# Patient Record
Sex: Female | Born: 1935 | Race: White | Hispanic: No
Health system: Southern US, Community
[De-identification: ages and names within clinical notes are randomized; demographics above are authoritative.]

---

## 2004-10-01 ENCOUNTER — Ambulatory Visit: Payer: Self-pay | Admitting: Unknown Physician Specialty

## 2005-01-04 ENCOUNTER — Ambulatory Visit: Payer: Self-pay | Admitting: Orthopedic Surgery

## 2005-01-23 ENCOUNTER — Ambulatory Visit: Payer: Self-pay | Admitting: Orthopedic Surgery

## 2005-05-09 ENCOUNTER — Ambulatory Visit: Payer: Self-pay | Admitting: Orthopedic Surgery

## 2005-08-08 ENCOUNTER — Ambulatory Visit: Payer: Self-pay | Admitting: Unknown Physician Specialty

## 2005-08-08 ENCOUNTER — Encounter: Payer: Self-pay | Admitting: Unknown Physician Specialty

## 2005-08-14 ENCOUNTER — Encounter: Payer: Self-pay | Admitting: Unknown Physician Specialty

## 2005-09-13 ENCOUNTER — Encounter: Payer: Self-pay | Admitting: Unknown Physician Specialty

## 2005-11-19 ENCOUNTER — Ambulatory Visit: Payer: Self-pay | Admitting: Unknown Physician Specialty

## 2005-12-10 ENCOUNTER — Ambulatory Visit: Payer: Self-pay | Admitting: Unknown Physician Specialty

## 2006-01-14 ENCOUNTER — Other Ambulatory Visit: Payer: Self-pay

## 2006-01-14 ENCOUNTER — Emergency Department: Payer: Self-pay | Admitting: Emergency Medicine

## 2006-01-14 ENCOUNTER — Ambulatory Visit: Payer: Self-pay | Admitting: Orthopedic Surgery

## 2006-12-11 ENCOUNTER — Other Ambulatory Visit: Payer: Self-pay

## 2006-12-11 ENCOUNTER — Ambulatory Visit: Payer: Self-pay | Admitting: Orthopedic Surgery

## 2006-12-18 ENCOUNTER — Ambulatory Visit: Payer: Self-pay | Admitting: Orthopedic Surgery

## 2007-02-19 ENCOUNTER — Ambulatory Visit: Payer: Self-pay | Admitting: Unknown Physician Specialty

## 2007-03-30 ENCOUNTER — Emergency Department: Payer: Self-pay | Admitting: Unknown Physician Specialty

## 2007-03-30 ENCOUNTER — Other Ambulatory Visit: Payer: Self-pay

## 2007-04-01 ENCOUNTER — Ambulatory Visit: Payer: Self-pay | Admitting: Unknown Physician Specialty

## 2007-05-18 ENCOUNTER — Emergency Department: Payer: Self-pay

## 2008-06-14 ENCOUNTER — Ambulatory Visit: Payer: Self-pay | Admitting: Unknown Physician Specialty

## 2008-09-21 ENCOUNTER — Emergency Department: Payer: Self-pay | Admitting: Emergency Medicine

## 2009-01-22 ENCOUNTER — Emergency Department: Payer: Self-pay | Admitting: Unknown Physician Specialty

## 2009-01-28 ENCOUNTER — Emergency Department: Payer: Self-pay | Admitting: Emergency Medicine

## 2009-02-03 ENCOUNTER — Ambulatory Visit: Payer: Self-pay | Admitting: Unknown Physician Specialty

## 2009-02-15 ENCOUNTER — Ambulatory Visit: Payer: Self-pay | Admitting: Ophthalmology

## 2009-02-28 ENCOUNTER — Ambulatory Visit: Payer: Self-pay | Admitting: Ophthalmology

## 2009-06-16 ENCOUNTER — Ambulatory Visit: Payer: Self-pay | Admitting: Unknown Physician Specialty

## 2010-04-14 ENCOUNTER — Emergency Department: Payer: Self-pay | Admitting: Emergency Medicine

## 2010-07-16 ENCOUNTER — Emergency Department: Payer: Self-pay | Admitting: Emergency Medicine

## 2010-09-01 ENCOUNTER — Inpatient Hospital Stay: Payer: Self-pay | Admitting: Internal Medicine

## 2010-09-10 ENCOUNTER — Ambulatory Visit: Payer: Self-pay | Admitting: Unknown Physician Specialty

## 2011-02-21 ENCOUNTER — Ambulatory Visit: Payer: Self-pay | Admitting: Unknown Physician Specialty

## 2011-09-12 ENCOUNTER — Ambulatory Visit: Payer: Self-pay | Admitting: Unknown Physician Specialty

## 2012-03-31 ENCOUNTER — Ambulatory Visit: Payer: Self-pay | Admitting: Unknown Physician Specialty

## 2012-04-10 ENCOUNTER — Encounter: Payer: Self-pay | Admitting: Cardiothoracic Surgery

## 2012-04-13 ENCOUNTER — Encounter: Payer: Self-pay | Admitting: Cardiothoracic Surgery

## 2012-08-18 ENCOUNTER — Inpatient Hospital Stay: Payer: Self-pay | Admitting: Internal Medicine

## 2012-08-18 LAB — CBC WITH DIFFERENTIAL/PLATELET
Basophil #: 0 10*3/uL (ref 0.0–0.1)
Basophil %: 0.1 %
Eosinophil %: 0.1 %
HCT: 34.8 % — ABNORMAL LOW (ref 35.0–47.0)
MCH: 26.9 pg (ref 26.0–34.0)
MCV: 82 fL (ref 80–100)
Monocyte #: 0.7 x10 3/mm (ref 0.2–0.9)
Monocyte %: 6.3 %
Platelet: 404 10*3/uL (ref 150–440)
RDW: 17.1 % — ABNORMAL HIGH (ref 11.5–14.5)

## 2012-08-18 LAB — COMPREHENSIVE METABOLIC PANEL
Alkaline Phosphatase: 109 U/L (ref 50–136)
Anion Gap: 9 (ref 7–16)
BUN: 14 mg/dL (ref 7–18)
Calcium, Total: 9 mg/dL (ref 8.5–10.1)
Chloride: 106 mmol/L (ref 98–107)
Creatinine: 0.57 mg/dL — ABNORMAL LOW (ref 0.60–1.30)
EGFR (African American): >60
Osmolality: 284 (ref 275–301)
SGOT(AST): 23 U/L (ref 15–37)
Total Protein: 7.6 g/dL (ref 6.4–8.2)

## 2012-08-18 LAB — URINALYSIS, COMPLETE
Leukocyte Esterase: NEGATIVE
Ph: 7 (ref 4.5–8.0)
Protein: NEGATIVE
RBC,UR: <1 /HPF (ref 0–5)
Specific Gravity: 1.023 (ref 1.003–1.030)
Squamous Epithelial: <1

## 2012-09-16 ENCOUNTER — Ambulatory Visit: Payer: Self-pay | Admitting: Unknown Physician Specialty

## 2012-11-28 ENCOUNTER — Inpatient Hospital Stay: Payer: Self-pay | Admitting: Internal Medicine

## 2012-11-28 LAB — CBC WITH DIFFERENTIAL/PLATELET
Basophil #: 0 10*3/uL (ref 0.0–0.1)
Basophil %: 0.4 %
Eosinophil #: 0.1 10*3/uL (ref 0.0–0.7)
Eosinophil %: 0.5 %
HCT: 43.5 % (ref 35.0–47.0)
HGB: 13.7 g/dL (ref 12.0–16.0)
MCH: 26.2 pg (ref 26.0–34.0)
MCHC: 31.6 g/dL — ABNORMAL LOW (ref 32.0–36.0)
Monocyte #: 0.7 x10 3/mm (ref 0.2–0.9)
Neutrophil #: 11.2 10*3/uL — ABNORMAL HIGH (ref 1.4–6.5)
WBC: 13 10*3/uL — ABNORMAL HIGH (ref 3.6–11.0)

## 2012-11-28 LAB — COMPREHENSIVE METABOLIC PANEL
Anion Gap: 9 (ref 7–16)
BUN: 41 mg/dL — ABNORMAL HIGH (ref 7–18)
Bilirubin,Total: 0.9 mg/dL (ref 0.2–1.0)
EGFR (African American): >60
EGFR (Non-African Amer.): >60
Glucose: 135 mg/dL — ABNORMAL HIGH (ref 65–99)
SGOT(AST): 36 U/L (ref 15–37)
SGPT (ALT): 26 U/L (ref 12–78)

## 2012-11-28 LAB — URINALYSIS, COMPLETE
Bilirubin,UR: NEGATIVE
Glucose,UR: NEGATIVE mg/dL (ref 0–75)
Nitrite: POSITIVE
RBC,UR: 5 /HPF (ref 0–5)
Squamous Epithelial: NONE SEEN

## 2012-11-28 LAB — TROPONIN I
Troponin-I: 0.11 ng/mL — ABNORMAL HIGH
Troponin-I: 0.03 ng/mL

## 2012-11-28 LAB — CK TOTAL AND CKMB (NOT AT ARMC): CK-MB: 1.9 ng/mL (ref 0.5–3.6)

## 2012-11-28 LAB — RAPID INFLUENZA A&B ANTIGENS

## 2012-11-29 LAB — CBC WITH DIFFERENTIAL/PLATELET
HCT: 39 % (ref 35.0–47.0)
HGB: 12 g/dL (ref 12.0–16.0)
Lymphocyte #: 1.6 10*3/uL (ref 1.0–3.6)
MCH: 25.7 pg — ABNORMAL LOW (ref 26.0–34.0)
MCV: 83 fL (ref 80–100)
Monocyte #: 1 x10 3/mm — ABNORMAL HIGH (ref 0.2–0.9)
Neutrophil #: 9.3 10*3/uL — ABNORMAL HIGH (ref 1.4–6.5)
Neutrophil %: 78.2 %
Platelet: 368 10*3/uL (ref 150–440)
RBC: 4.69 10*6/uL (ref 3.80–5.20)
WBC: 11.9 10*3/uL — ABNORMAL HIGH (ref 3.6–11.0)

## 2012-11-29 LAB — BASIC METABOLIC PANEL
Anion Gap: 7 (ref 7–16)
BUN: 39 mg/dL — ABNORMAL HIGH (ref 7–18)
Calcium, Total: 8.8 mg/dL (ref 8.5–10.1)
Calcium, Total: 8.7 mg/dL (ref 8.5–10.1)
Chloride: 125 mmol/L — ABNORMAL HIGH (ref 98–107)
Creatinine: 0.51 mg/dL — ABNORMAL LOW (ref 0.60–1.30)
EGFR (African American): >60
EGFR (African American): >60
EGFR (Non-African Amer.): >60
EGFR (Non-African Amer.): >60
Glucose: 135 mg/dL — ABNORMAL HIGH (ref 65–99)
Glucose: 144 mg/dL — ABNORMAL HIGH (ref 65–99)
Osmolality: 324 (ref 275–301)
Osmolality: 323 (ref 275–301)
Potassium: 3.8 mmol/L (ref 3.5–5.1)
Sodium: 158 mmol/L — ABNORMAL HIGH (ref 136–145)

## 2012-11-29 LAB — MAGNESIUM: Magnesium: 2.3 mg/dL

## 2012-11-29 LAB — LIPID PANEL
HDL Cholesterol: 21 mg/dL — ABNORMAL LOW (ref 40–60)
Ldl Cholesterol, Calc: 84 mg/dL (ref 0–100)

## 2012-11-29 LAB — CK TOTAL AND CKMB (NOT AT ARMC): CK, Total: 237 U/L — ABNORMAL HIGH (ref 21–215)

## 2012-11-29 LAB — TROPONIN I: Troponin-I: 0.13 ng/mL — ABNORMAL HIGH

## 2012-11-30 LAB — BASIC METABOLIC PANEL
Anion Gap: 9 (ref 7–16)
BUN: 23 mg/dL — ABNORMAL HIGH (ref 7–18)
Calcium, Total: 8.3 mg/dL — ABNORMAL LOW (ref 8.5–10.1)
Chloride: 124 mmol/L — ABNORMAL HIGH (ref 98–107)
Creatinine: 0.48 mg/dL — ABNORMAL LOW (ref 0.60–1.30)
EGFR (African American): >60
Glucose: 113 mg/dL — ABNORMAL HIGH (ref 65–99)
Osmolality: 310 (ref 275–301)
Potassium: 3.7 mmol/L (ref 3.5–5.1)
Sodium: 154 mmol/L — ABNORMAL HIGH (ref 136–145)

## 2012-11-30 LAB — TROPONIN I: Troponin-I: 0.07 ng/mL — ABNORMAL HIGH

## 2012-11-30 LAB — URINE CULTURE

## 2012-12-01 LAB — BASIC METABOLIC PANEL
Anion Gap: 8 (ref 7–16)
BUN: 11 mg/dL (ref 7–18)
Calcium, Total: 8.1 mg/dL — ABNORMAL LOW (ref 8.5–10.1)
Chloride: 115 mmol/L — ABNORMAL HIGH (ref 98–107)
Creatinine: 0.47 mg/dL — ABNORMAL LOW (ref 0.60–1.30)
EGFR (African American): >60
EGFR (Non-African Amer.): >60
Potassium: 3.1 mmol/L — ABNORMAL LOW (ref 3.5–5.1)

## 2012-12-04 LAB — CULTURE, BLOOD (SINGLE)

## 2012-12-12 DEATH — deceased

## 2012-12-15 LAB — CULTURE, BLOOD (SINGLE)

## 2013-11-11 IMAGING — CR DG FEMUR 2V*R*
1 series · 4 of 4 positions shown · non-contrast
Comparison: none

REASON FOR EXAM: pain - right upper leg
COMMENTS:

PROCEDURE:     DXR - DXR FEMUR RIGHT  - August 18, 2012  [DATE]
RESULT:     Right femur images demonstrate degenerative change of the right
knee without definite fracture, dislocation or radiopaque foreign body.

[Series 1: ap · 0.17mm/px · 4 of 4 slices shown]
[im 1/4]
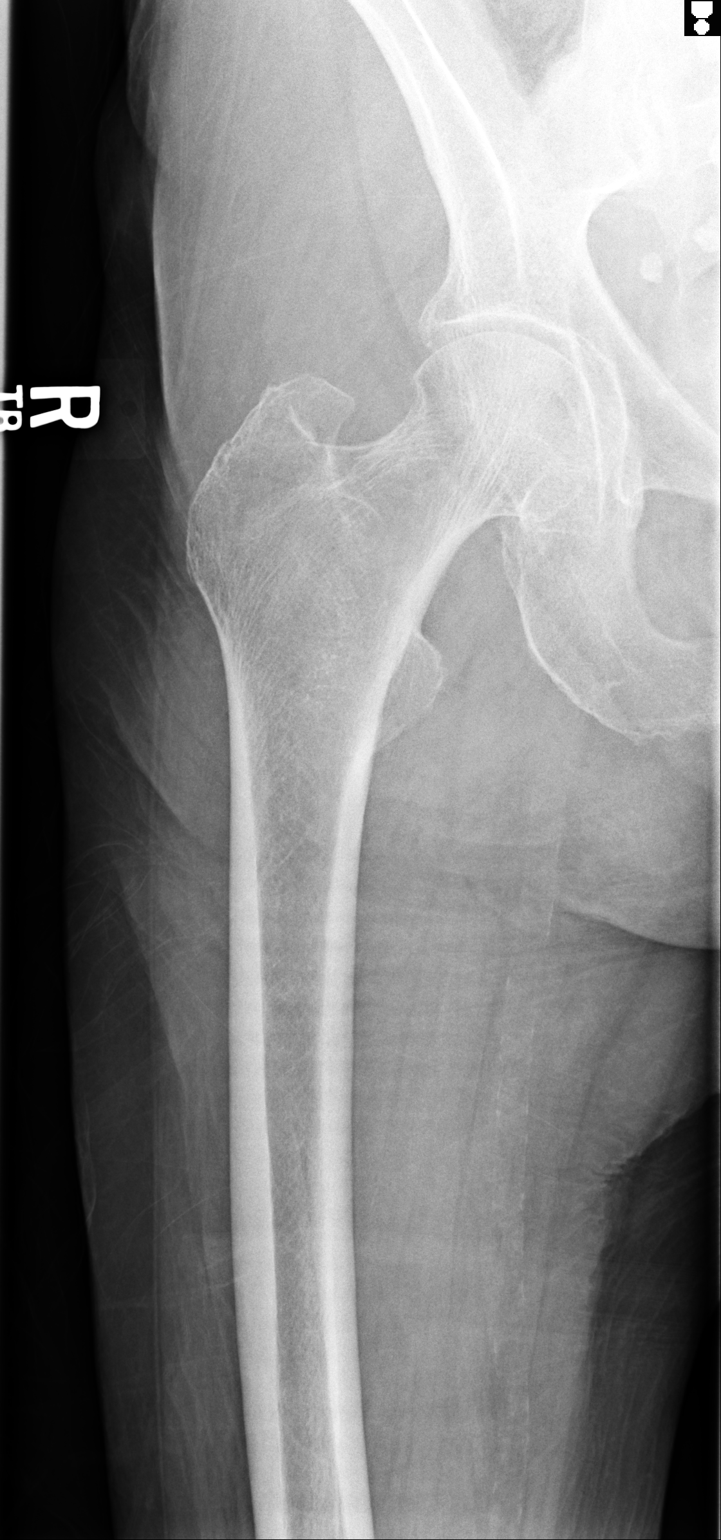
[im 2/4]
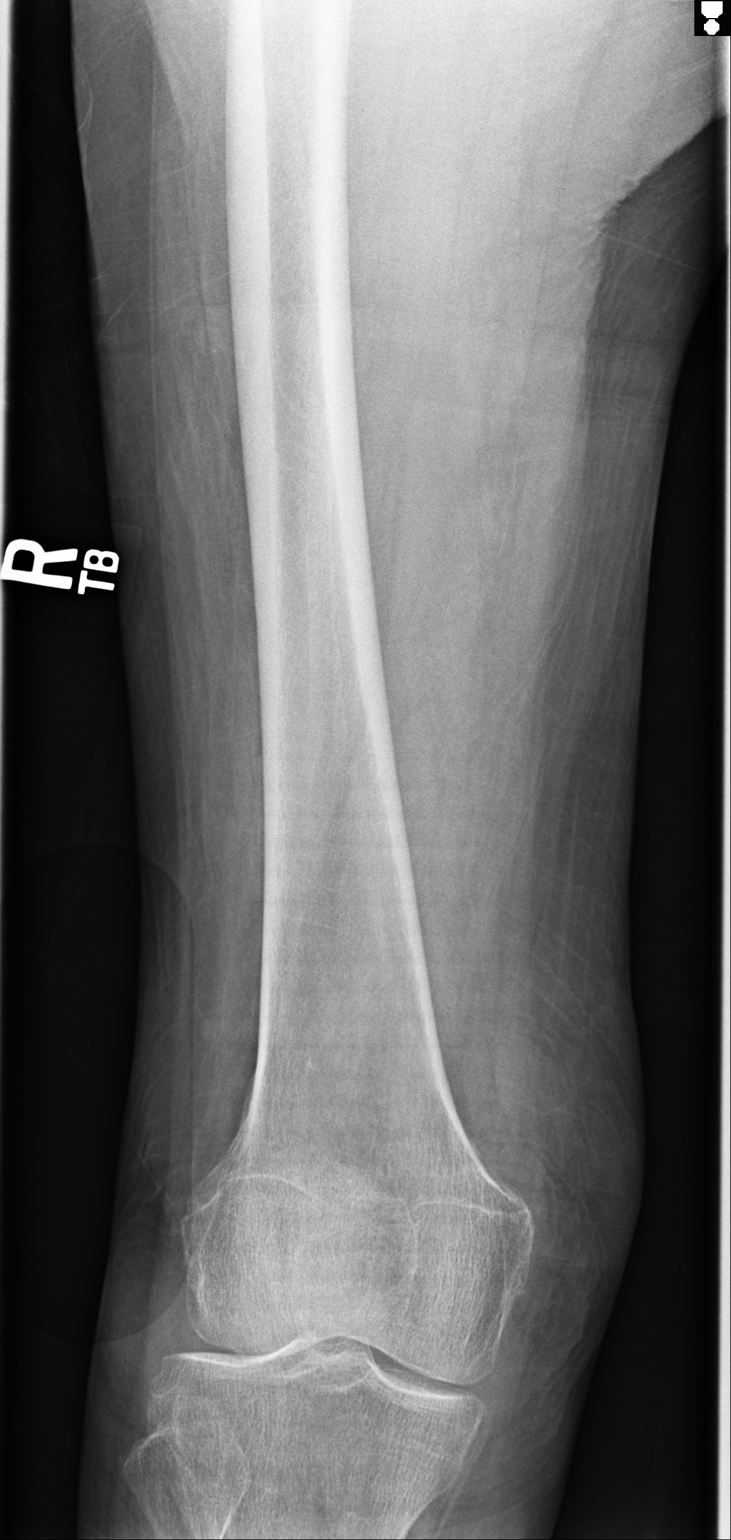
[im 3/4]
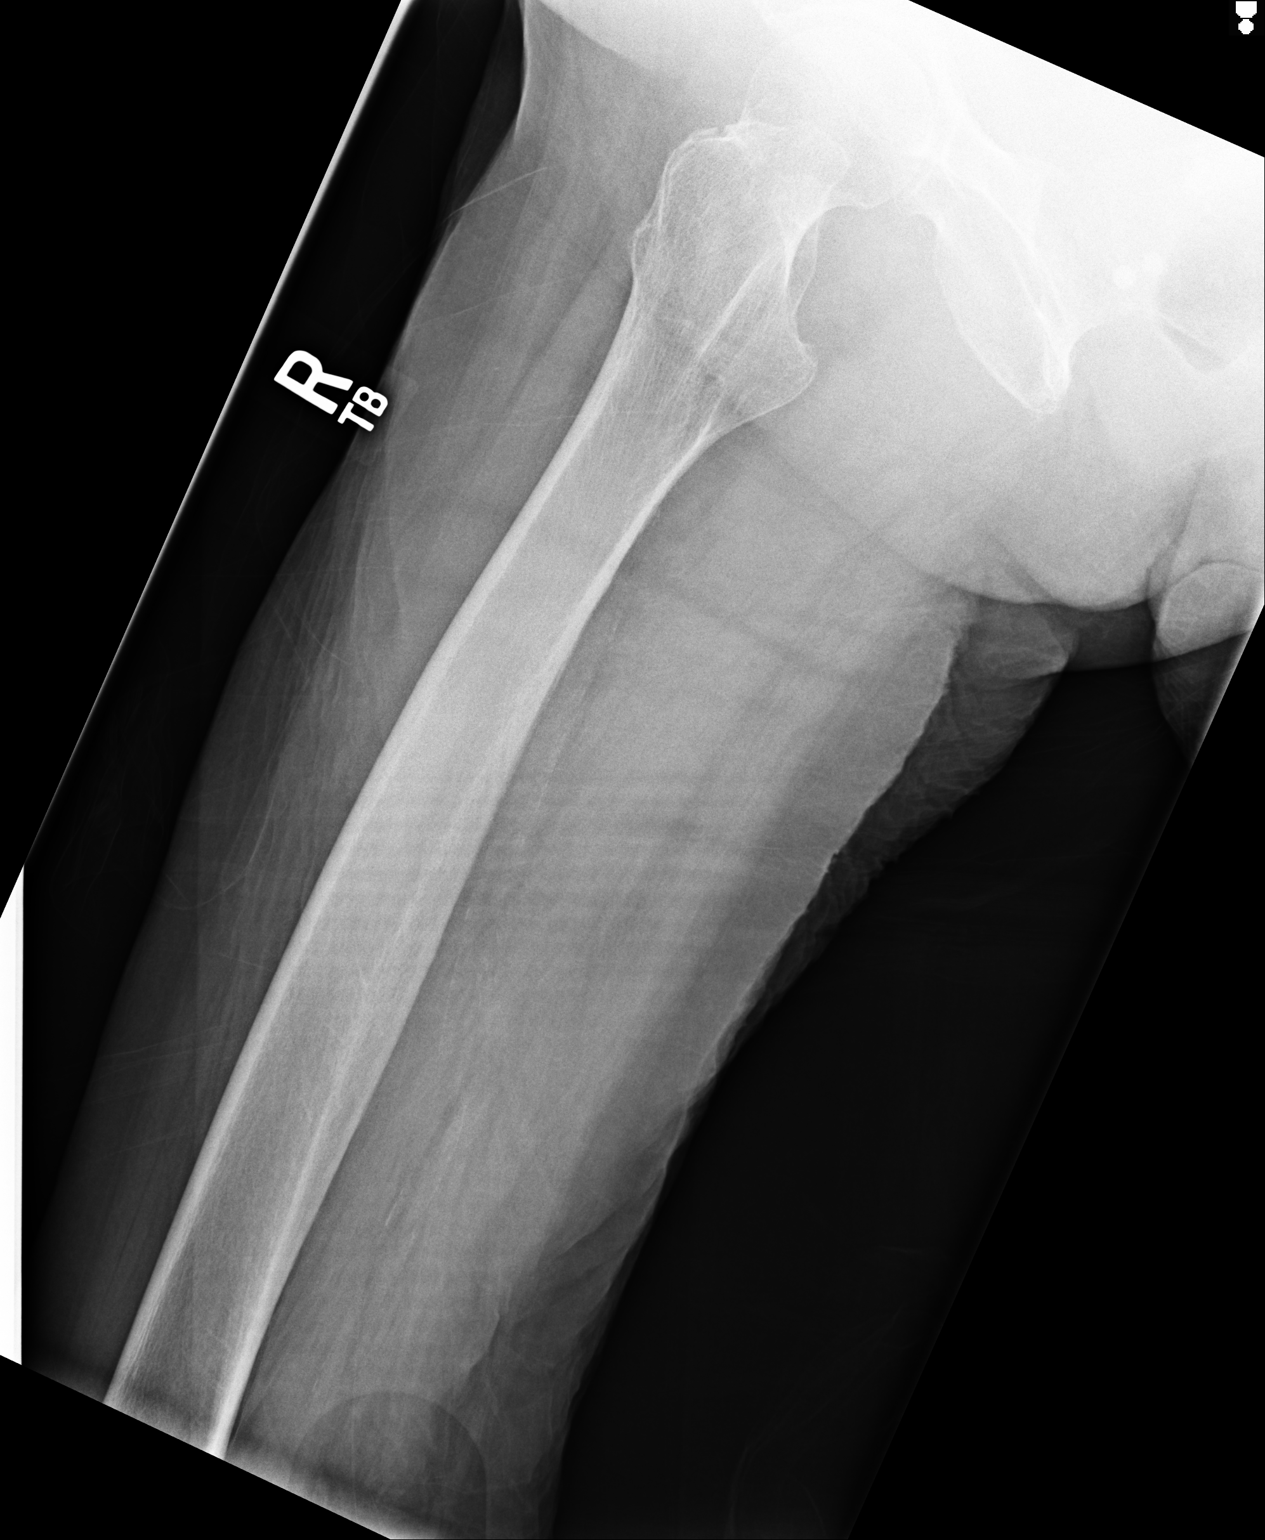
[im 4/4]
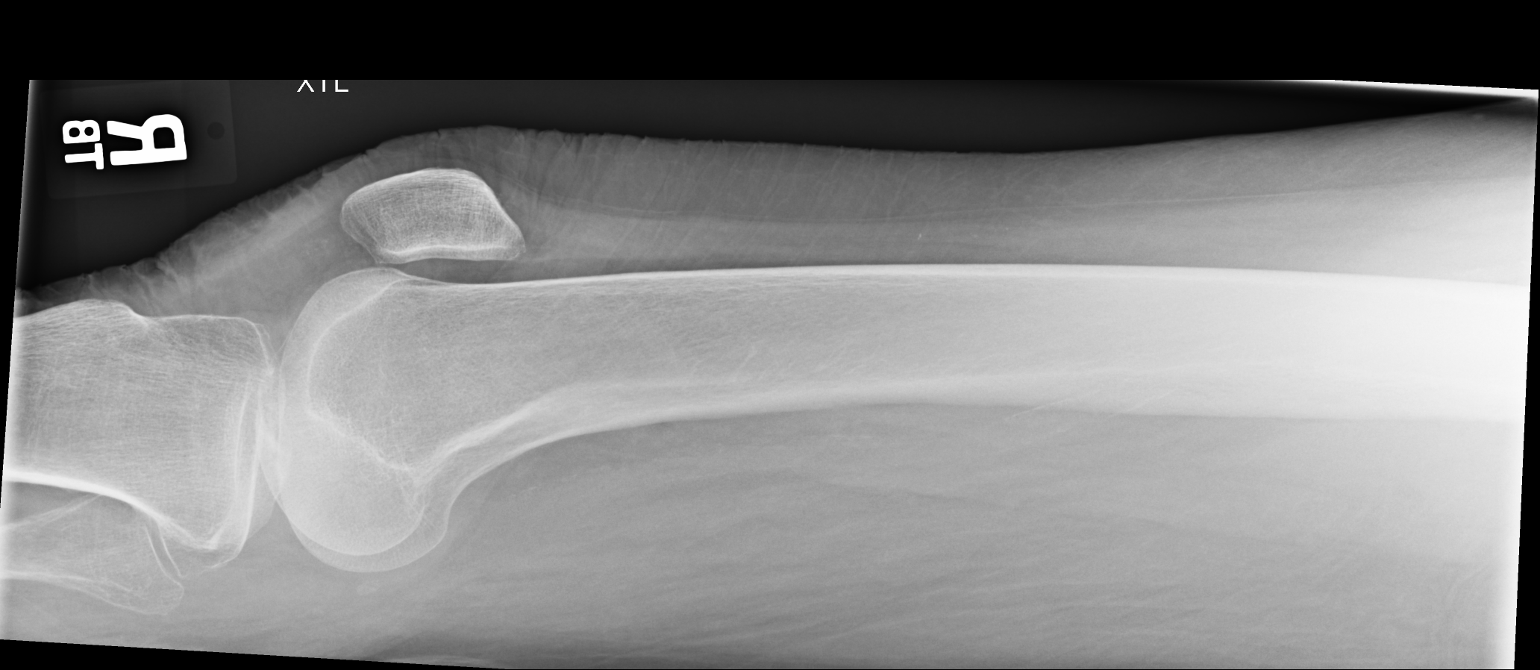

[4 of 4 positions shown; findings below may reference images not displayed]

IMPRESSION: Please see above.

[REDACTED]

## 2013-11-11 IMAGING — CT CT OF THE RIGHT HIP WITHOUT CONTRAST
1 series · 15 of 32 positions shown, 19 images · non-contrast
Comparison: none

REASON FOR EXAM: ongoing pain - Right hip - unable to ambulate
COMMENTS:

[Series 2: hip 3.0 b70s · axial · 0.39mm/px · z∈[-958,-775]mm · 15 of 69 slices shown, 19 images]
[im 5/69  soft-tissue]
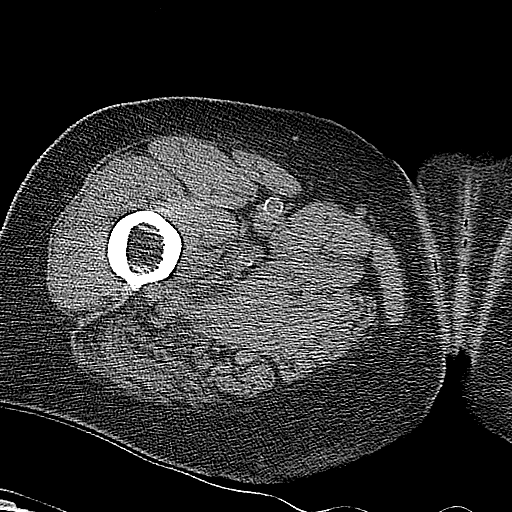
[im 5/69  bone]
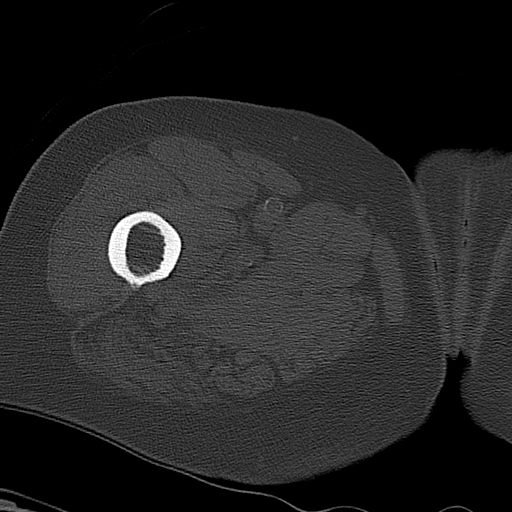
[im 9/69  soft-tissue]
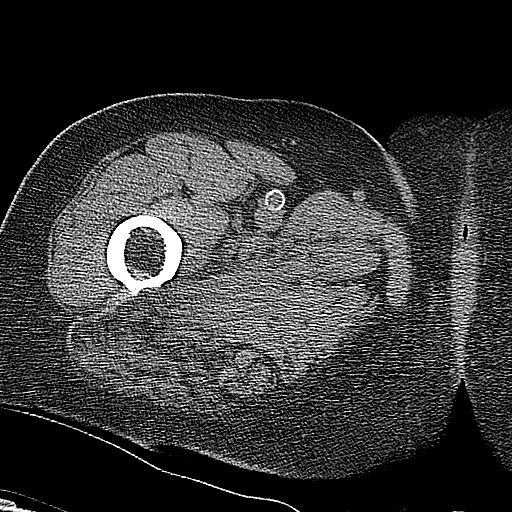
[im 14/69  soft-tissue]
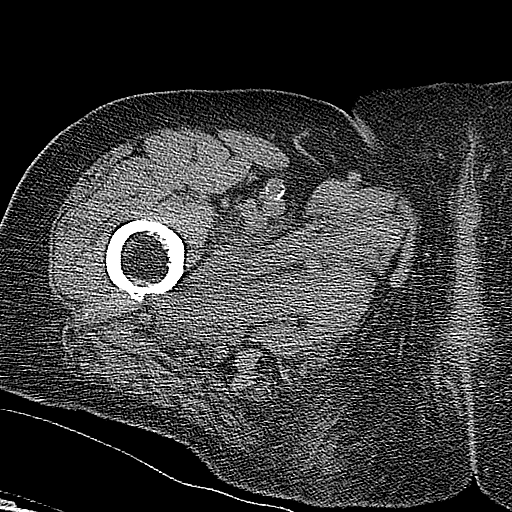
[im 20/69  soft-tissue]
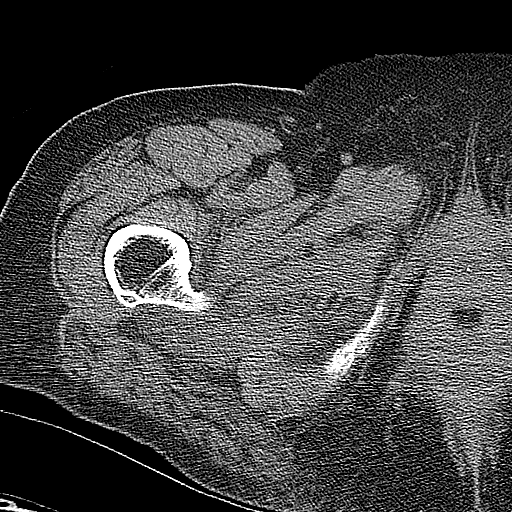
[im 25/69  soft-tissue]
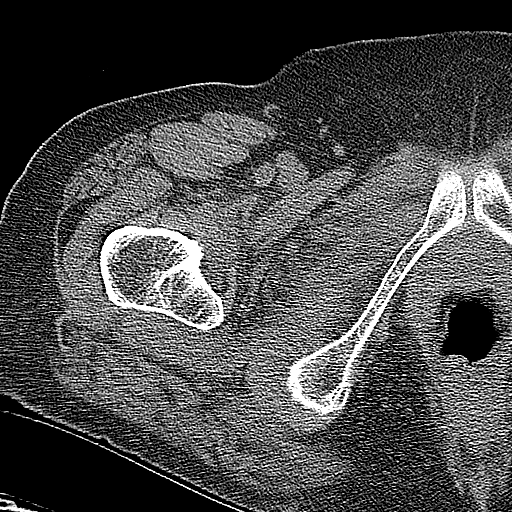
[im 29/69  soft-tissue]
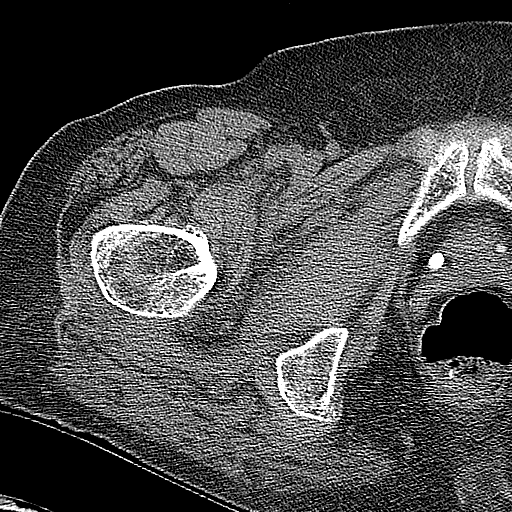
[im 36/69  soft-tissue]
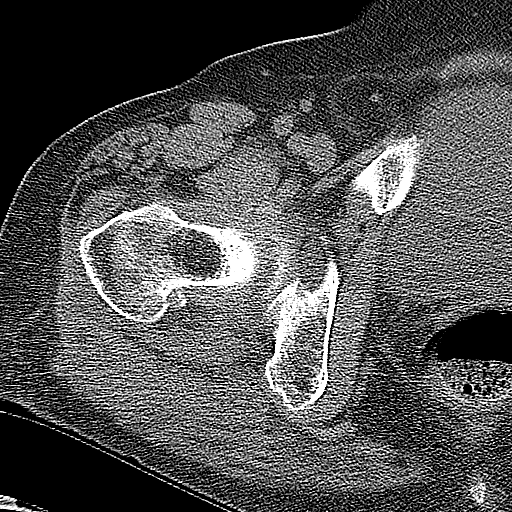
[im 40/69  soft-tissue]
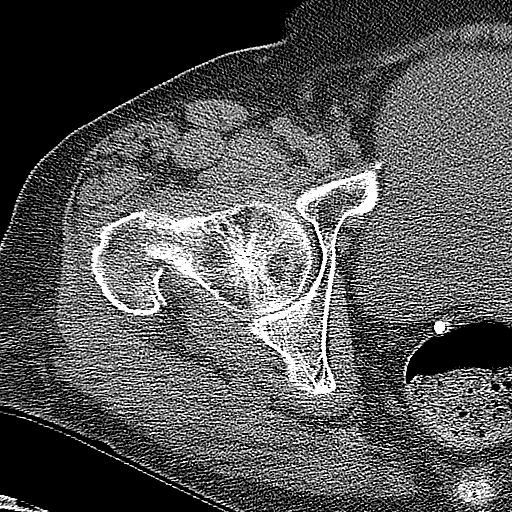
[im 44/69  soft-tissue]
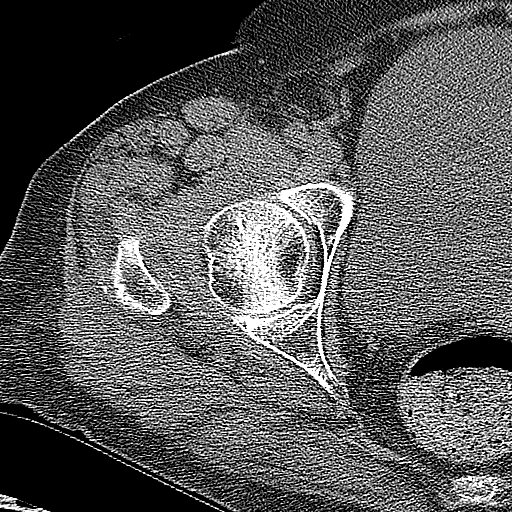
[im 44/69  bone]
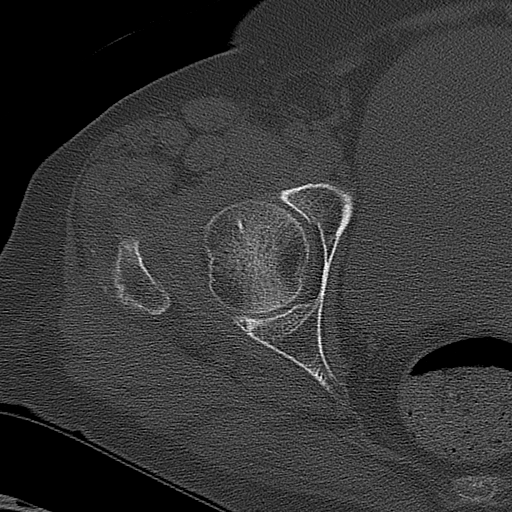
[im 49/69  soft-tissue]
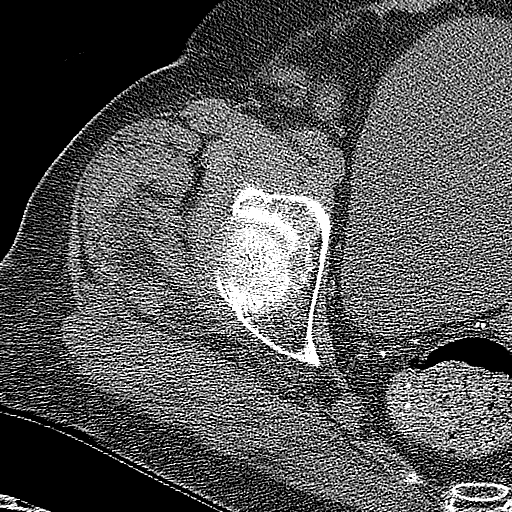
[im 55/69  soft-tissue]
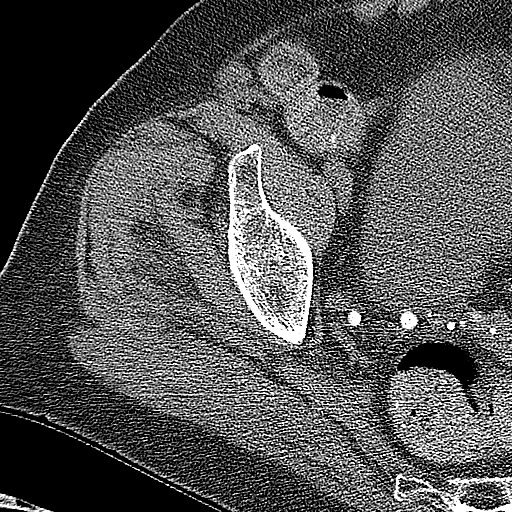
[im 60/69  soft-tissue]
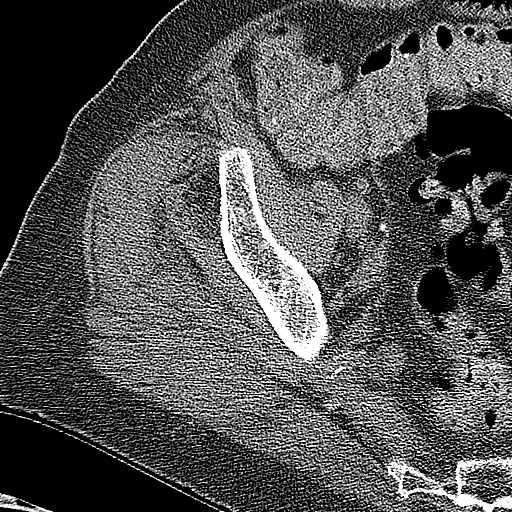
[im 60/69  lung]
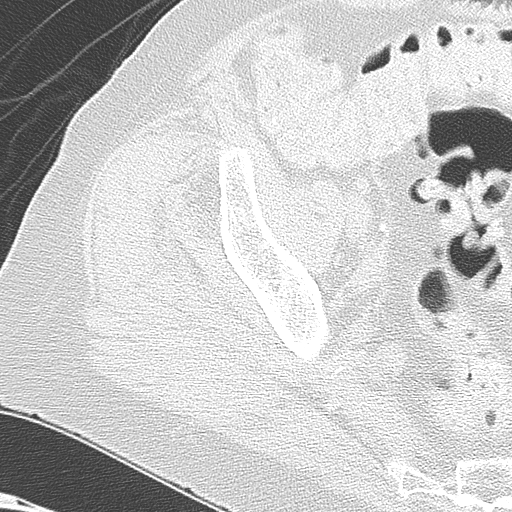
[im 62/69  lung]
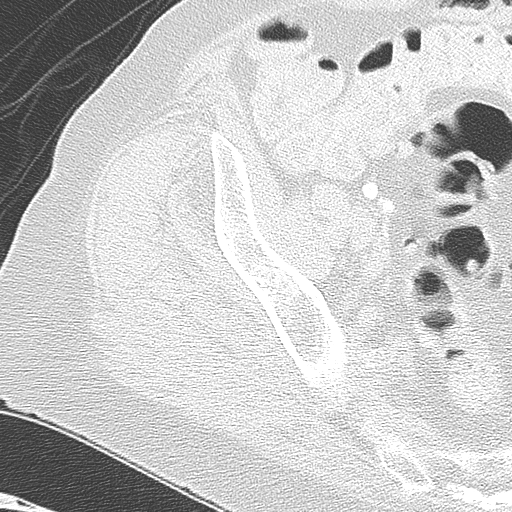
[im 64/69  soft-tissue]
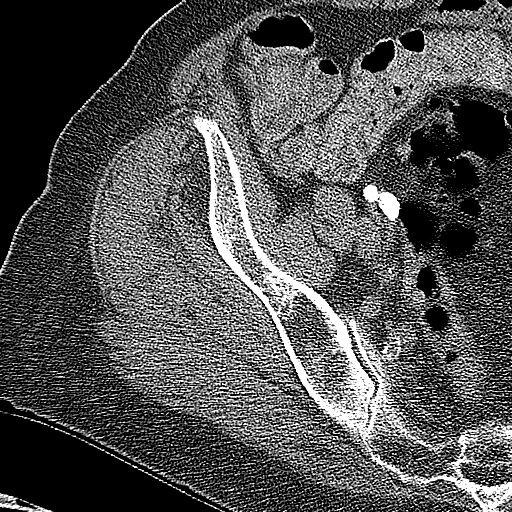
[im 64/69  lung]
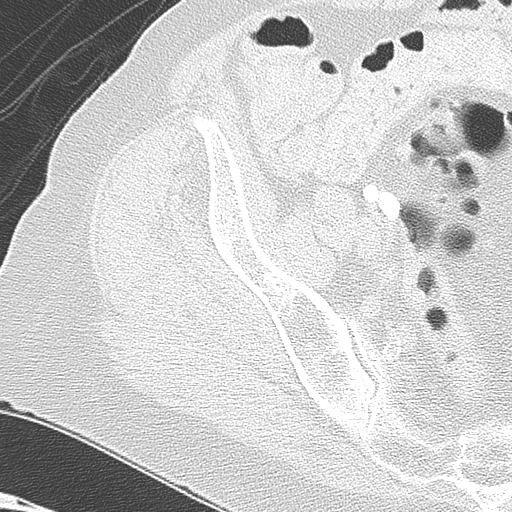
[im 66/69  lung]
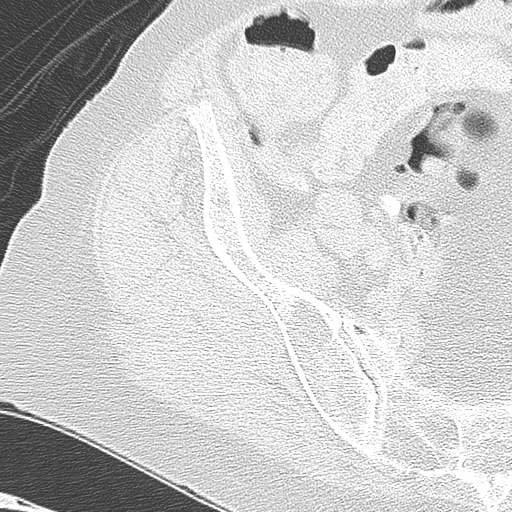

[15 of 32 positions shown; findings below may reference images not displayed]

PROCEDURE:     CT  - CT HIP RIGHT WITHOUT CONTRAST  - August 18, 2012  [DATE]

RESULT:     Sagittal, axial, and coronal images through the right hip are
reviewed. A bone algorithm was employed.

There is cortical disruption along the medial aspect of the greater
trochanter which extends posteriorly consistent with a fracture. There may
be minimal involvement of the superior aspect of the base of the femoral
neck. This is demonstrated best on images 30 through approximately 32 on the
axial images. There is no evidence of cortical disruption along the inferior
aspect of the femoral neck and so the fracture may be confined to the
greater trochanter and a small portion of the superior aspect of the base of
the femoral neck.

The acetabulum appears intact. The right superior and inferior pubic rami
exhibit no acute abnormalities.
IMPRESSION: 1. There is a definite minimally displaced fracture involving the greater
trochanter.
2. I cannot exclude minimal involvement of the superior aspect of the base
of the femoral neck. There is no definite through and through fracture of
the femoral neck base. It may be useful to consider the patient for MRI in
an effort to detect bone marrow edema within the femoral neck if she can
tolerate the procedure.

[REDACTED]

## 2014-02-21 IMAGING — CR DG CHEST 1V PORT
1 series · 1 of 1 positions shown · non-contrast
Comparison: none

REASON FOR EXAM: fever
COMMENTS:

PROCEDURE:     DXR - DXR PORTABLE CHEST SINGLE VIEW  - November 28, 2012 [DATE]
RESULT:     The lungs are clear. The cardiac silhouette and visualized bony
skeleton are unremarkable.

[ap]
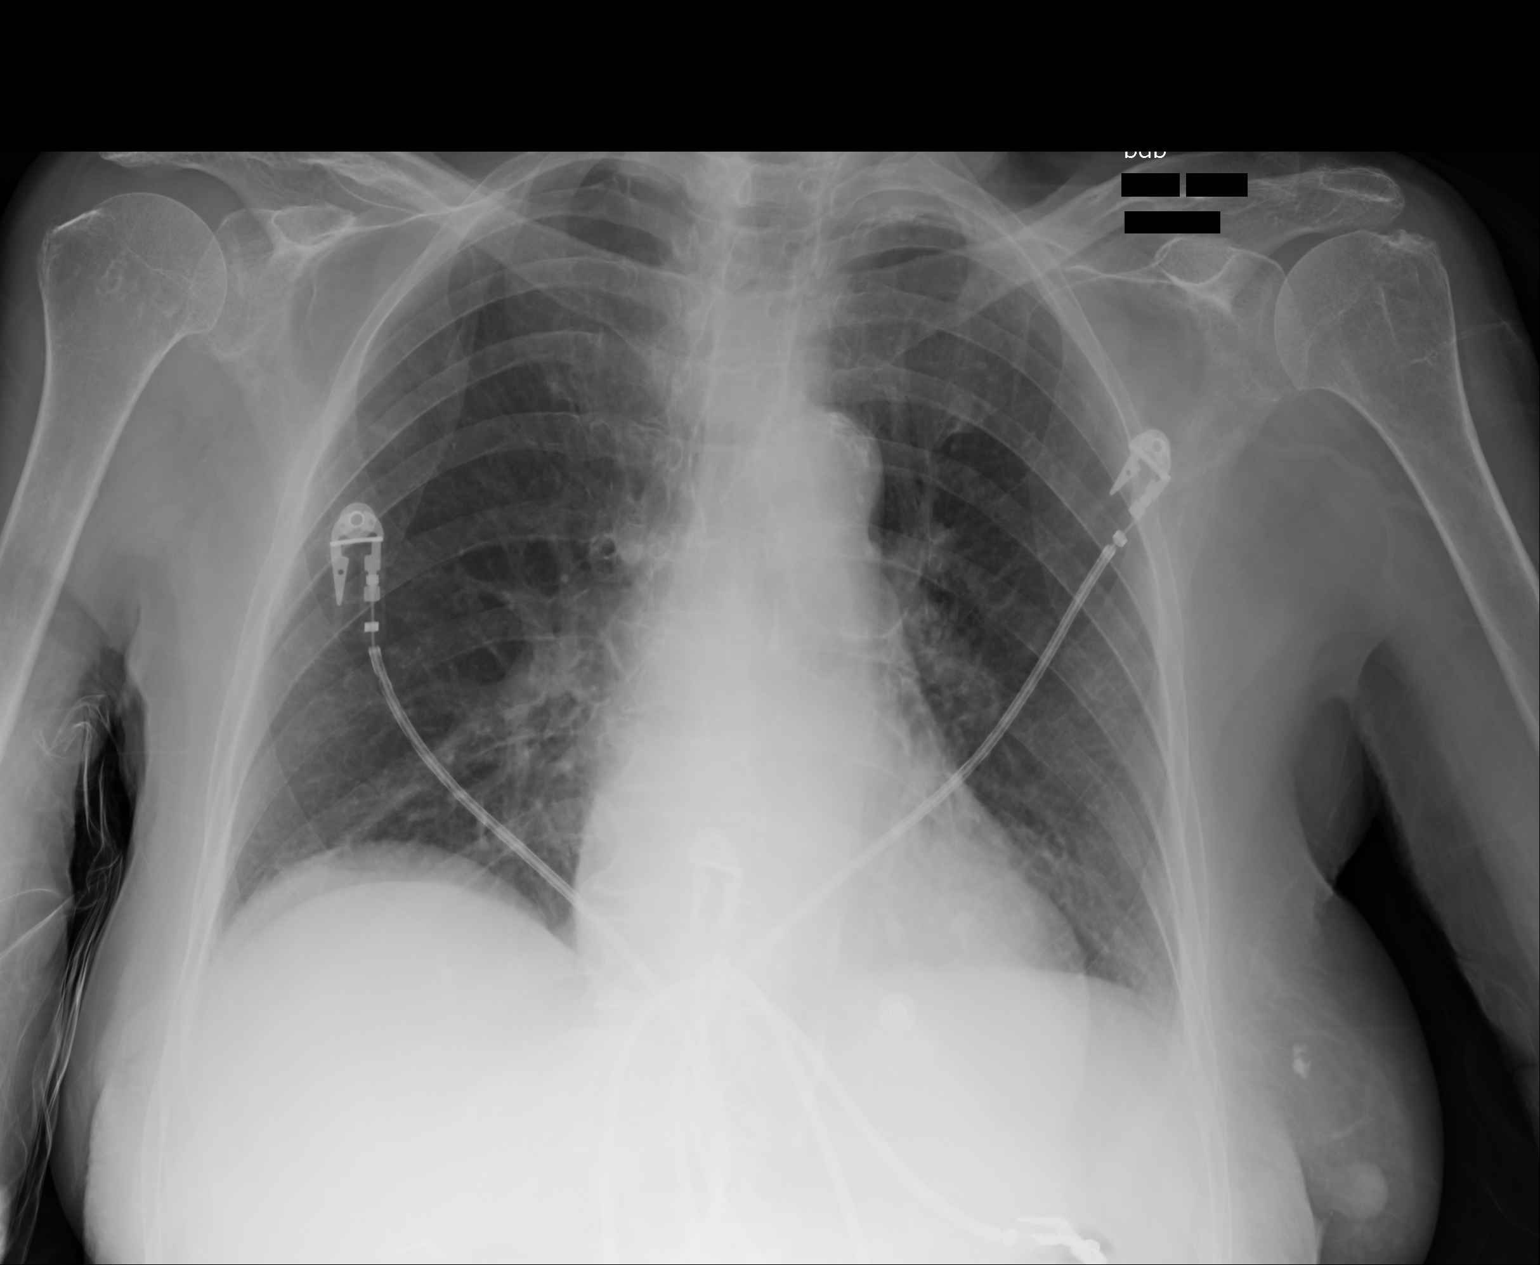

[1 of 1 positions shown; findings below may reference images not displayed]

IMPRESSION: 1. Chest radiograph without evidence of acute cardiopulmonary disease.

## 2015-01-31 NOTE — Discharge Summary (Signed)
PATIENT NAME:  Cassie DolinWHITLOW, Rylea O MR#:  [id number] DATE OF BIRTH:  1920-05-19  DATE OF ADMISSION:  08/18/2012 DATE OF DISCHARGE:  08/21/2012  DISPOSITION: To [facility].   DISCHARGE DIAGNOSES:  1. Right hip fracture.  2. History of advanced dementia.  3. Hypertension.  4. Depression.  5. Anxiety.  6. Hyperlipidemia.  7. Gastroesophageal reflux disease.   DISCHARGE MEDICATIONS:  1. Namenda 10 mg daily. 2. Zoloft 25 mg daily. 3. Prilosec 20 mg p.o. daily.  4. Aspirin 81 mg daily.   5. Acetaminophen/oxycodone 5/325 mg every 6 hours as needed for pain.  6. Amlodipine 10 mg daily.  DIET: Low sodium diet.   FOLLOW UP: The patient has follow up with Dr. Ernest PineHooten on 11/12.    CONSULTATIONS: Physical Therapy consult.   LABORATORY, DIAGNOSTIC AND RADIOLOGICAL DATA: CT of the right hip showed a minimally displaced fracture of the greater trochanter and probably cannot exclude minimal involvement of superior aspect of the base of the femoral neck. WBC 10.9, hemoglobin 11.4, hematocrit 34.8, platelets 404. Electrolytes: Sodium is 142, potassium 3.9, chloride 106, bicarbonate 27, BUN 14, creatinine 0.57. Urine is amber-colored urine. Knee x-ray shows joint space narrowing without fracture. Pelvic x-ray shows no acute bony abnormality.   HOSPITAL COURSE: The patient is a 81 year old female with severe advanced dementia, admitted to the Medical Service because the patient had a fall with right hip fracture.  Look at the History and Physical for full details. Dr. Rosita KeaMenz was been contacted in the ER and  recommended a nonsurgical approach. The patient was admitted to Medicine and seen by Physical Therapy for full weightbearing. Because of severe advanced dementia and her episodes of agitation, she required continuous supervision in the hospital. The patient was living with her husband, and the daughter and the son were taking care off them on a frequent basis; but because of the fracture, the patient  needing a lot of assistance, Physical Therapy also recommended rehab. The patient has limited ambulation with a rolling walker due to confusion and decreased endurance. The patient is [facility] rehab and then probably change to a skilled nursing facility. The patient is going for physical therapy and needs a front-wheeled walker.  Hypertension: She is not on blood pressure medication at home, but here blood pressure is running high this morning, 182/83, and heart rate is around 88. She was started on Norvasc 10 mg daily. The patient can continue other medications.   I discussed the plan with the patient's daughter. She can continue pain medicine if she needs to.   TIME SPENT ON DISCHARGE PREPARATION: More than 30 minutes   ____________________________ Katha HammingSnehalatha Alaura Schippers, MD sk:cbb D: 08/21/2012 14:06:29 ET T: 08/22/2012 15:16:25 ET JOB#: [id number]  cc: Katha HammingSnehalatha Climmie Cronce, MD, <Dictator> Katha HammingSNEHALATHA Yuvan Medinger MD ELECTRONICALLY SIGNED 09/08/2012 11:46

## 2015-01-31 NOTE — H&P (Signed)
PATIENT NAME:  Cassie [name], Cassie [name] MR#:  [id number] DATE OF BIRTH:  20-Dec-1919  DATE OF ADMISSION:  08/18/2012  PRIMARY CARE PHYSICIAN: Silver Huguenin, MD   CHIEF COMPLAINT: Right hip pain after fall.   HISTORY OF PRESENT ILLNESS: Cassie [name] is a 81 year old Caucasian female who has a past medical history of depression, anxiety, dementia, and hyperlipidemia who comes to the Emergency Room accompanied by her son after she had a fall yesterday and the day before at home and started having significant pain in her right hip. Her initial pelvis and right hip x-rays were negative. In the Emergency Room, the patient was unable to bear any weight on the right hip and was in significant pain. CT of the right hip was ordered which showed minimally displaced right greater trochanter hip fracture. ER physician discussed with Dr. Rosita Kea who recommends nonsurgical approach and, hence, the patient is being admitted on the Medicine service. She currently is denying any pain. She has some back pain.   PAST MEDICAL HISTORY:  1. History of Alzheimer's dementia.  2. Depression/anxiety.  3. Arthritis with chronic back pain.  4. Hyperlipidemia.  5. Gastroesophageal reflux disease.   PAST SURGICAL HISTORY:  1. Index finger joint replacement.  2. Carpal tunnel release.  3. Appendectomy.   ALLERGIES: Codeine and sulfa.   MEDICATIONS:  1. Zoloft 25 mg p.o. daily.  2. Prilosec 20 mg p.o. daily.  3. Namenda 10 mg p.o. daily.  4. Aspirin 81 mg p.o. daily.   SOCIAL HISTORY: Lives at home with her husband who is 55 years old. Nonsmoker. The patient does have some home health which is arranged by the patient's daughter who stays with them as well.   FAMILY HISTORY: Positive for hypertension.   REVIEW OF SYSTEMS: CONSTITUTIONAL: No fever. Positive fatigue, weakness, and right hip pain. EYES: No blurred or double vision. No glaucoma. ENT: No tinnitus, ear pain, hearing loss. RESPIRATORY: No cough, wheeze, hemoptysis.  CARDIOVASCULAR: No chest pain or edema. GI: No nausea, vomiting, diarrhea, or abdominal pain. GU: No dysuria or hematuria. ENDOCRINE: No polyuria, nocturia, or thyroid problems. HEMATOLOGY: No anemia or easy bruising. SKIN: No acne or rash. MUSCULOSKELETAL: Positive for arthritis and pain in the right hip. NEUROLOGICAL: No CVA or TIA. Positive for dementia. PSYCH: No anxiety. No symptoms suggestive of anxiety or depression. All other systems reviewed and negative.   PHYSICAL EXAMINATION:   GENERAL: The patient is awake, alert, oriented x3. She is hard of hearing.   VITAL SIGNS: Afebrile, pulse 82, blood pressure 165/80, sats are 96% on room air.   HEENT: Atraumatic, normocephalic. Pupils equal, round, and reactive to light and accommodation. Extraocular movements intact. Oral mucosa is dry.   NECK: Supple. No JVD. No carotid bruit.   RESPIRATORY: Clear to auscultation bilaterally. No rales, rhonchi, respiratory distress, or labored breathing.   CARDIOVASCULAR: Both the heart sounds are normal. Rate, rhythm regular. PMI not lateralized. Chest nontender.   ABDOMEN: Soft, benign, nontender. No organomegaly.   EXTREMITIES: Good pedal pulses. Good femoral pulses. The patient has trace lower extremity edema.   SKIN: Has several skin tears with scabs on the tibial shins bilaterally and the left knee. The patient has a very "papery" skin and very dry skin.   NEUROLOGIC: Grossly intact cranial nerves II through XII. No focal deficits noted.   LABORATORY, DIAGNOSTIC, AND RADIOLOGICAL DATA: EKG shows normal sinus rhythm, possible left atrial enlargement.   CT of the right hip showed definite minimal displaced fracture involving the greater  trochanter. I cannot exclude minimal involvement of the superior aspect of the base of the femoral neck. There is no definite through and through fracture of the femoral neck base.   Hemoglobin and hematocrit 11.4 and 34.8, platelet count 404. Comprehensive  metabolic panel within normal limits. Urinalysis negative for urinary tract infection.   Right knee suggestive of degenerative joint disease.   AP pelvis no acute fracture noted.   ASSESSMENT: 81 year old Cassie [name] with history of dementia, anxiety/depression who has had several falls at home comes in with:  1. Right hip pain after fall with CT evidence of minimal displaced right greater trochanter hip fracture status post fall at home. The patient has had several falls at home in the past few days. She forgets to use her walker which has been prescribed to her. Dr. Rosita KeaMenz spoke with the ER MD and suggests nonoperative management. Will await Dr. Neomia GlassMenz's recommendations regarding starting physical therapy. P.r.n. IV pain meds. Will consider rehab once seen by Dr. Rosita KeaMenz.  2. Frequent falls at home. The patient will need rehabilitation.  3. Dementia. Continue Namenda.  4. Depression/anxiety. Continue Zoloft.  5. DVT prophylaxis with heparin sub-Q b.i.d.  6. Further work-up according to the patient's clinical course.   Hospital admission plan was discussed with patient, the patient's son, Cassie [name], who was present in the Emergency Room.      TIME SPENT: 55 minutes.   ____________________________ Wylie HailSona A. Allena KatzPatel, MD sap:drc D: 08/18/2012 16:25:17 ET T: 08/18/2012 17:01:25 ET JOB#: [id number]  cc: Ailey Wessling A. Allena KatzPatel, MD, <Dictator> Yetta FlockAileen H. Miller, MD Willow OraSONA A Bonnee Zertuche MD ELECTRONICALLY SIGNED 08/18/2012 17:40

## 2015-02-03 NOTE — H&P (Signed)
PATIENT NAME:  Cassie, [name] MR#:  [id number] DATE OF BIRTH:  01-Jul-1920  DATE OF ADMISSION:  11/28/2012  PRIMARY CARE PHYSICIAN: Cassie Mosses T. Garey Ham, MD  History obtained in part from the patient's daughter -Cassie [name], phone number [phone], and from old records available in our system.   CHIEF COMPLAINT: Fever, altered mental status.   HISTORY OF PRESENT ILLNESS: A 81 year old female, who is a nursing home resident for the last few weeks. In the past, she was staying at home with family. She had her hip fractured and admitted in November 2013 to our hospital and sent to rehab. Almost after a month at rehab, she recovered enough that she was able to walk with support, and family took her home, but then she was too much dependent in day-to-day activity and demented that they could not take care anymore, and they sent her to [facility]. Today, she is sent over here because of fever and altered mental status. The patient is not able to give any history. Records and results checked, and she was found having urinary tract infection as UA is positive, so she is being admitted for that.   REVIEW OF SYSTEMS: Unable to get, as the patient has altered mental status.   PAST MEDICAL HISTORY: Alzheimer's dementia, depression, arthritis, hyperlipidemia, gastroesophageal reflux disease.   PAST SURGICAL HISTORY: Index finger joint replacement, carpal tunnel disease, appendectomy and hip fracture.   ALLERGIES: TO CODEINE AND SULFA.   MEDICATIONS BEFORE ADMISSION:  1.  Amlodipine 10 mg daily.  2.  Aspirin 81 mg daily.  3.  Clonazepam 0.5 mg twice a day.  4.  Gabapentin 100 mg every morning.  5.  Gabapentin 100 mg two tablets at night.  6.  Namenda 10 mg twice a day.  7.  Omeprazole 20 mg once a day.  8.  Sertraline 25 mg every night.  9.  Lorazepam 1 mg/mL gel every 6 hours as needed for anxiety.  10. Promethazine 12.5 mg every 6 hours as needed for nausea.  11. Sucralfate 1 g.    SOCIAL HISTORY: Lives at nursing home recently. She is a known smoker and until before a few days she was living with family, her daughter and husband.   FAMILY HISTORY: Positive for hypertension.   PHYSICAL EXAMINATION: GENERAL APPEARANCE: The patient is appropriate for her age and does not appear in any acute distress. She is demented. She is alert but not able to tell me anything other than her name. Also appears having some hearing problem because I have to tell it loud and repeat the question repeatedly, even for her name, and then she replied her name only.  HEENT: Conjunctivae pink. Oral mucosa dry.  NECK: Supple. No JVD.  RESPIRATORY: Bilateral clear and equal air entry.  CARDIOVASCULAR: S1, S2 present. Tachycardia. No murmur.  ABDOMEN: Soft, nontender. Bowel sounds present. No organomegaly. Foley catheter in place. Dark and somewhat dirty-looking urine present in the urine collection bag.  EXTREMITIES: Bilateral mild edema present on the legs with chronic hyperpigmented skin changes in both legs.  NEUROLOGICAL: Power 4/5 in left upper limb. Right upper limb and both lower limbs she is on moving minimal power 3/5. Does not follow commands, as she is demented. No tremors or seizures.  PSYCHIATRIC: Unable to assess at this point in time, as she is demented.  VITALS: Temperature 103.5 when she arrived to the Emergency Room, pulse 105 on arrival to the Emergency Room, respirations 18, blood pressure 124/60 on arrival  and pulse ox 97 on room air.   LAB RESULTS: Glucose 135, BUN 41, creatine 0.85, sodium 156, potassium 3.1, chloride 121, CO2 26, calcium 9.0, troponin 0.11, WBC 13, hemoglobin 13.7, platelet count 459, MCV 83, influenza A and B negative. Urinalysis: Cloudy, nitrite positive, leukocyte esterase 2+, WBC 35 and bacteria 3+ in the urine. Chest x-ray: No acute cardiopulmonary disease.   ASSESSMENT AND PLAN: 1.  Urosepsis, fever, leukocytosis, tachycardia and altered mental status  due to infection. IV Rocephin started by ER physician. We will continue, and we will wait for urine culture for further workup.  2.  Non-ST-elevation myocardial infarction. Troponin is 0.1. ST depression, which is new, in lateral leads. I spoke to her daughter, who is healthcare power of attorney on phone, and as per her, the patient is old, she is DNR, she has poor prognosis, and family understands that, so she agrees for not to do any extensive workup or give anticoagulants and high-risk medications. She agrees just to give aspirin and oral medications to protect heart from further stress. We will follow her 3 troponins to see the cause of the event, and we will get echocardiogram. Possibly this is due to stress of demand versus supply in an event of infection. We will follow further.  3.  Dehydration, elevated BUN and causing hypernatremia. We will hydrate her with D5 half NS ER physician already gave bolus of normal saline.  4.  Hypokalemia. We will replace orally and recheck.  5.  Dementia. We will continue sertraline and Namenda. 6.  Code status. DNR. Confirmed with healthcare power of attorney, daughter, whose number is found in the patient's info and contacts.   TOTAL TIME SPENT: Fifty-five minutes.    ____________________________ Hope PigeonVaibhavkumar G. Elisabeth PigeonVachhani, MD vgv:lg D: 11/28/2012 15:14:59 ET T: 11/28/2012 16:34:29 ET JOB#: [id number]  cc: Hope PigeonVaibhavkumar G. Elisabeth PigeonVachhani, MD, <Dictator> Altamese DillingVAIBHAVKUMAR Marlies Ligman MD ELECTRONICALLY SIGNED 12/04/2012 19:47

## 2015-02-03 NOTE — Discharge Summary (Signed)
PATIENT NAME:  Cassie [name], Cassie [name] MR#:  [id number] DATE OF BIRTH:  [birth year]  DATE OF ADMISSION:  11/28/2012 DATE OF DISCHARGE:    PRIMARY CARE PHYSICIAN: Dr. Tyson Babinski.   DISCHARGE DIAGNOSES:  1.  Urinary tract infection.  2.  Dementia. 3.  Dehydration.  4.  Hyponatremia. 5.  Hypokalemia.   HISTORY OF PRESENT ILLNESS:  This is a 81 year old female who is in an assisted living facility for the last few weeks.  In the past she was staying at home with his family and then she had hip fracture and admitted in November 2013.  She was sent to rehab and then went home but then she was too much dependent in day-to-day activity and demented so they could not take her home to provide proper care and so she was placed in assisted living facility. She was sent to hospital on February 15th  because of fever and altered mental status. The patient was not able to give any history. History obtained from the patient's daughter and medical records and in the ER she was found having urinalysis positive. She was admitted for treatment of UTI.   HOSPITAL COURSE AND STAY:  For urinary tract infection, she was started on Zosyn.  Urine culture was sent for that. Urine culture was reported positive for gram-negative rods  which was sensitive to cephalosporins, resistant to Levaquin and so we are discharging her on cefuroxime orally to be finishing the course for the next 3 more days.  OTHER MEDICAL ISSUES DURING THIS HOSPITAL STAY:  Elevated troponin level on admission. Troponin level was 0.1 and it came down to 0.07 on follow-up so we are attributing it toward ischemia due to demand and supply and infection.  We did not do any further work-up for that.   Hypernatremia and dehydration. The patient's sodium was 158 on admission and after hydration it came down to 147.  Chloride was 125 and it came down to 115 after hydration. She may need to have follow up on her sodium and potassium level after 1 week to make sure  that she is not dehydrated again.  Hypokalemia. Her potassium level went down to 2.8. We checked magnesium which was 2.3.  We replaced the potassium and came up to 3.7 and then it dropped again to 3.1. We are replacing IV and we are discharging her with oral replacement for the next 3 days.   Dementia. This is her baseline and we are continuing her sertraline and Namenda while she is in the hospital and Ativan as needed for anxiety episodes.    IMPORTANT LABORATORY AND DIAGNOSTIC DATA IN THE HOSPITAL STAY:  As mentioned above, sodium was 158, potassium 2.8, chloride 125 on admission. Troponin 0.11 on admission.  White cell count was 13,000. Blood culture x 2 were negative. Urine culture grew more than 100,000 colony-forming units of E. coli which were sensitive to ceftriaxone, resistant to levofloxacin.  Urinalysis was positive with 35 WBCs on admission.   CONDITION ON DISCHARGE: Stable.   CODE STATUS ON discharge: DO NOT RESUSCITATE.   MEDICATIONS ADVISED ON DISCHARGE:  Aspirin 81 mg once a day, clonazepam 0.5 mg oral tablet 2 times a day, gabapentin 100 mg oral capsule 2 capsules once a day at bedtime and gabapentin 100 mg capsule 1 capsule once a day in the morning, Namenda 10 mg oral tablet 2 times a day, omeprazole 20 mg oral delayed-release capsule 2 times a day, sertraline 25 mg oral once a day, loperamide 2  mg oral capsule 4 times a day as needed after each loose bowel movement, promethazine 12.5 mg oral tablet every 6 hours as needed for nausea, sucralfate 1 gram oral tablet 4 times a day as needed after meals. simvastatin 20 mg oral tablet once a day, metoprolol 25 mg oral tablet 2 times a day, cefuroxime 500 mg oral tablet 2 times a day for 3 days, Klor-Con 20 mEq oral powder for reconstitution 1 each once a day for 3 days.   HOME HEALTH:  Yes.  Physical therapy and nurse aide.   HOME HEALTH INSTRUCTIONS:  Weakness, dementia, and UTI. Home oxygen: No.   DIET ON DISCHARGE: Low sodium. Diet  consistency: Regular.   ACTIVITY LIMITATION:  As tolerated.   TIMEFRAME TO FOLLOW UP:  Within 1 to 2 weeks with primary care physician, Dr. Tyson Babinskiinah Feldstein.  Advised to follow BMP for sodium and potassium in 1 to 2 weeks.  Total time spent on this discharge: 45 minutes.     ____________________________ Hope PigeonVaibhavkumar G. Elisabeth PigeonVachhani, MD vgv:ct D: 12/01/2012 11:45:53 ET T: 12/01/2012 12:21:37 ET JOB#: [id number]  cc: Hope PigeonVaibhavkumar G. Elisabeth PigeonVachhani, MD, <Dictator> Tyson Babinskiinah Feldstein, MD, [address], [place], [place] [id number] Altamese DillingVAIBHAVKUMAR Dejana Pugsley MD ELECTRONICALLY SIGNED 12/04/2012 19:48
